# Patient Record
Sex: Female | Born: 1975 | Race: Black or African American | Hispanic: No
Health system: Southern US, Community
[De-identification: ages and names within clinical notes are randomized; demographics above are authoritative.]

---
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
## 2002-07-19 HISTORY — PX: BREAST EXCISIONAL BIOPSY: SUR124

## 2009-09-24 ENCOUNTER — Encounter: Admission: RE | Admit: 2009-09-24 | Discharge: 2009-09-24 | Payer: Self-pay | Admitting: Obstetrics and Gynecology

## 2011-11-09 ENCOUNTER — Other Ambulatory Visit (HOSPITAL_COMMUNITY)
Admission: RE | Admit: 2011-11-09 | Discharge: 2011-11-09 | Disposition: A | Discharge status: Home / Self Care | Payer: BC Managed Care – PPO | Source: Ambulatory Visit | Attending: Obstetrics and Gynecology | Admitting: Obstetrics and Gynecology

## 2011-11-09 DIAGNOSIS — Z124 Encounter for screening for malignant neoplasm of cervix: Secondary | ICD-10-CM | POA: Insufficient documentation

## 2011-11-09 DIAGNOSIS — Z1159 Encounter for screening for other viral diseases: Secondary | ICD-10-CM | POA: Insufficient documentation

## 2011-11-15 ENCOUNTER — Other Ambulatory Visit: Payer: Self-pay | Admitting: Obstetrics and Gynecology

## 2011-11-15 DIAGNOSIS — Z1231 Encounter for screening mammogram for malignant neoplasm of breast: Secondary | ICD-10-CM

## 2011-11-19 ENCOUNTER — Ambulatory Visit
Admission: RE | Admit: 2011-11-19 | Discharge: 2011-11-19 | Disposition: A | Discharge status: Home / Self Care | Payer: BC Managed Care – PPO | Source: Ambulatory Visit | Attending: Obstetrics and Gynecology | Admitting: Obstetrics and Gynecology

## 2011-11-19 DIAGNOSIS — Z1231 Encounter for screening mammogram for malignant neoplasm of breast: Secondary | ICD-10-CM

## 2011-11-22 ENCOUNTER — Ambulatory Visit: Payer: Self-pay

## 2013-08-28 ENCOUNTER — Other Ambulatory Visit: Payer: Self-pay

## 2013-08-28 DIAGNOSIS — Z1231 Encounter for screening mammogram for malignant neoplasm of breast: Secondary | ICD-10-CM

## 2013-09-21 ENCOUNTER — Ambulatory Visit
Admission: RE | Admit: 2013-09-21 | Discharge: 2013-09-21 | Disposition: A | Discharge status: Home / Self Care | Payer: BC Managed Care – PPO | Source: Ambulatory Visit

## 2013-09-21 DIAGNOSIS — Z1231 Encounter for screening mammogram for malignant neoplasm of breast: Secondary | ICD-10-CM

## 2014-09-27 ENCOUNTER — Other Ambulatory Visit: Payer: Self-pay

## 2014-09-27 DIAGNOSIS — Z1231 Encounter for screening mammogram for malignant neoplasm of breast: Secondary | ICD-10-CM

## 2014-10-09 ENCOUNTER — Ambulatory Visit
Admission: RE | Admit: 2014-10-09 | Discharge: 2014-10-09 | Disposition: A | Discharge status: Home / Self Care | Payer: BLUE CROSS/BLUE SHIELD | Source: Ambulatory Visit

## 2014-10-09 DIAGNOSIS — Z1231 Encounter for screening mammogram for malignant neoplasm of breast: Secondary | ICD-10-CM

## 2015-07-29 MED FILL — DOXYCYCLINE MONO 100 MG CAP: 100 | 5 days supply | Qty: 10 | Fill #0

## 2015-09-08 ENCOUNTER — Other Ambulatory Visit: Payer: Self-pay

## 2015-09-08 DIAGNOSIS — Z1231 Encounter for screening mammogram for malignant neoplasm of breast: Secondary | ICD-10-CM

## 2015-10-10 ENCOUNTER — Ambulatory Visit
Admission: RE | Admit: 2015-10-10 | Discharge: 2015-10-10 | Disposition: A | Discharge status: Home / Self Care | Payer: 59 | Source: Ambulatory Visit

## 2015-10-10 DIAGNOSIS — Z1231 Encounter for screening mammogram for malignant neoplasm of breast: Secondary | ICD-10-CM

## 2015-10-15 ENCOUNTER — Other Ambulatory Visit: Payer: Self-pay | Admitting: Obstetrics and Gynecology

## 2015-10-15 DIAGNOSIS — R928 Other abnormal and inconclusive findings on diagnostic imaging of breast: Secondary | ICD-10-CM

## 2015-10-17 ENCOUNTER — Ambulatory Visit
Admission: RE | Admit: 2015-10-17 | Discharge: 2015-10-17 | Disposition: A | Discharge status: Home / Self Care | Payer: 59 | Source: Ambulatory Visit | Attending: Obstetrics and Gynecology | Admitting: Obstetrics and Gynecology

## 2015-10-17 DIAGNOSIS — R928 Other abnormal and inconclusive findings on diagnostic imaging of breast: Secondary | ICD-10-CM

## 2015-10-22 ENCOUNTER — Other Ambulatory Visit: Payer: 59

## 2016-09-08 ENCOUNTER — Other Ambulatory Visit: Payer: Self-pay | Admitting: Obstetrics and Gynecology

## 2016-09-08 DIAGNOSIS — Z1231 Encounter for screening mammogram for malignant neoplasm of breast: Secondary | ICD-10-CM

## 2016-10-11 ENCOUNTER — Ambulatory Visit
Admission: RE | Admit: 2016-10-11 | Discharge: 2016-10-11 | Disposition: A | Discharge status: Home / Self Care | Payer: BLUE CROSS/BLUE SHIELD | Source: Ambulatory Visit | Attending: Obstetrics and Gynecology | Admitting: Obstetrics and Gynecology

## 2016-10-11 DIAGNOSIS — Z1231 Encounter for screening mammogram for malignant neoplasm of breast: Secondary | ICD-10-CM

## 2017-09-19 ENCOUNTER — Other Ambulatory Visit: Payer: Self-pay | Admitting: Obstetrics and Gynecology

## 2017-09-19 DIAGNOSIS — Z1231 Encounter for screening mammogram for malignant neoplasm of breast: Secondary | ICD-10-CM

## 2017-10-12 ENCOUNTER — Ambulatory Visit
Admission: RE | Admit: 2017-10-12 | Discharge: 2017-10-12 | Disposition: A | Discharge status: Home / Self Care | Payer: BLUE CROSS/BLUE SHIELD | Source: Ambulatory Visit | Attending: Obstetrics and Gynecology | Admitting: Obstetrics and Gynecology

## 2017-10-12 DIAGNOSIS — Z1231 Encounter for screening mammogram for malignant neoplasm of breast: Secondary | ICD-10-CM

## 2018-04-24 ENCOUNTER — Other Ambulatory Visit: Payer: Self-pay | Admitting: Nurse Practitioner

## 2018-04-24 DIAGNOSIS — N63 Unspecified lump in unspecified breast: Secondary | ICD-10-CM

## 2018-04-26 ENCOUNTER — Ambulatory Visit
Admission: RE | Admit: 2018-04-26 | Discharge: 2018-04-26 | Disposition: A | Discharge status: Home / Self Care | Payer: BLUE CROSS/BLUE SHIELD | Source: Ambulatory Visit | Attending: Nurse Practitioner | Admitting: Nurse Practitioner

## 2018-04-26 DIAGNOSIS — N63 Unspecified lump in unspecified breast: Secondary | ICD-10-CM

## 2018-10-02 ENCOUNTER — Other Ambulatory Visit: Payer: Self-pay | Admitting: Obstetrics and Gynecology

## 2018-10-02 DIAGNOSIS — Z1231 Encounter for screening mammogram for malignant neoplasm of breast: Secondary | ICD-10-CM

## 2018-10-26 ENCOUNTER — Ambulatory Visit: Payer: BLUE CROSS/BLUE SHIELD

## 2018-12-06 ENCOUNTER — Ambulatory Visit
Admission: RE | Admit: 2018-12-06 | Discharge: 2018-12-06 | Disposition: A | Discharge status: Home / Self Care | Payer: BLUE CROSS/BLUE SHIELD | Source: Ambulatory Visit | Attending: Obstetrics and Gynecology | Admitting: Obstetrics and Gynecology

## 2018-12-06 ENCOUNTER — Other Ambulatory Visit: Payer: Self-pay

## 2018-12-06 DIAGNOSIS — Z1231 Encounter for screening mammogram for malignant neoplasm of breast: Secondary | ICD-10-CM

## 2018-12-08 ENCOUNTER — Ambulatory Visit: Payer: BLUE CROSS/BLUE SHIELD

## 2019-04-25 ENCOUNTER — Other Ambulatory Visit: Payer: Self-pay

## 2019-04-25 DIAGNOSIS — Z20822 Contact with and (suspected) exposure to covid-19: Secondary | ICD-10-CM

## 2019-04-27 LAB — NOVEL CORONAVIRUS, NAA: SARS-CoV-2, NAA: NOT DETECTED

## 2019-07-06 ENCOUNTER — Other Ambulatory Visit: Payer: BC Managed Care – PPO

## 2019-07-09 ENCOUNTER — Ambulatory Visit: Payer: BC Managed Care – PPO | Attending: Internal Medicine

## 2019-07-09 DIAGNOSIS — Z20822 Contact with and (suspected) exposure to covid-19: Secondary | ICD-10-CM

## 2019-07-10 LAB — NOVEL CORONAVIRUS, NAA: SARS-CoV-2, NAA: NOT DETECTED

## 2019-07-11 ENCOUNTER — Ambulatory Visit: Payer: BC Managed Care – PPO | Attending: Internal Medicine

## 2019-07-11 DIAGNOSIS — U071 COVID-19: Secondary | ICD-10-CM

## 2019-07-12 LAB — NOVEL CORONAVIRUS, NAA: SARS-CoV-2, NAA: NOT DETECTED

## 2019-08-10 ENCOUNTER — Ambulatory Visit: Payer: BLUE CROSS/BLUE SHIELD | Attending: Internal Medicine

## 2019-08-10 DIAGNOSIS — Z20822 Contact with and (suspected) exposure to covid-19: Secondary | ICD-10-CM

## 2019-08-11 LAB — NOVEL CORONAVIRUS, NAA: SARS-CoV-2, NAA: NOT DETECTED

## 2019-10-18 ENCOUNTER — Ambulatory Visit: Payer: Self-pay | Attending: Family

## 2019-10-18 DIAGNOSIS — Z23 Encounter for immunization: Secondary | ICD-10-CM

## 2019-10-18 NOTE — Progress Notes (Signed)
   Covid-19 Vaccination Clinic  Name:  Sydney Maldonado    MRN: [id number] DOB: 11/18/1975  10/18/2019  [name] was observed post Covid-19 immunization for 15 minutes without incident. She was provided with Vaccine Information Sheet and instruction to access the V-Safe system.   [name] was instructed to call 911 with any severe reactions post vaccine: Marland Kitchen Difficulty breathing  . Swelling of face and throat  . A fast heartbeat  . A bad rash all over body  . Dizziness and weakness   Immunizations Administered    Name Date Dose VIS Date Route   Moderna COVID-19 Vaccine 10/18/2019  4:16 PM 0.5 mL 06/19/2019 Intramuscular   Manufacturer: Moderna   Lot: [id number]   [place]: [phone]

## 2019-11-06 ENCOUNTER — Other Ambulatory Visit: Payer: Self-pay | Admitting: Obstetrics and Gynecology

## 2019-11-06 DIAGNOSIS — Z1231 Encounter for screening mammogram for malignant neoplasm of breast: Secondary | ICD-10-CM

## 2019-11-13 ENCOUNTER — Ambulatory Visit: Payer: Self-pay

## 2019-11-27 ENCOUNTER — Ambulatory Visit: Payer: Self-pay | Attending: Family

## 2019-11-27 DIAGNOSIS — Z23 Encounter for immunization: Secondary | ICD-10-CM

## 2019-11-27 NOTE — Progress Notes (Signed)
   Covid-19 Vaccination Clinic  Name:  Sydney Maldonado    MRN: [id number] DOB: 1976/05/17  11/27/2019  Sydney Maldonado was observed post Covid-19 immunization for 15 minutes without incident. She was provided with Vaccine Information Sheet and instruction to access the V-Safe system.   Sydney Maldonado was instructed to call 911 with any severe reactions post vaccine: Marland Kitchen Difficulty breathing  . Swelling of face and throat  . A fast heartbeat  . A bad rash all over body  . Dizziness and weakness   Immunizations Administered    Name Date Dose VIS Date Route   Moderna COVID-19 Vaccine 11/27/2019 11:25 AM 0.5 mL 06/2019 Intramuscular   Manufacturer: Moderna   Lot: [id number]   [place]: [id number]

## 2020-01-08 ENCOUNTER — Ambulatory Visit
Admission: RE | Admit: 2020-01-08 | Discharge: 2020-01-08 | Disposition: A | Discharge status: Home / Self Care | Payer: BC Managed Care – PPO | Source: Ambulatory Visit | Attending: Obstetrics and Gynecology | Admitting: Obstetrics and Gynecology

## 2020-01-08 ENCOUNTER — Other Ambulatory Visit: Payer: Self-pay

## 2020-01-08 DIAGNOSIS — Z1231 Encounter for screening mammogram for malignant neoplasm of breast: Secondary | ICD-10-CM

## 2020-06-30 ENCOUNTER — Ambulatory Visit: Payer: BC Managed Care – PPO | Attending: Family

## 2020-06-30 DIAGNOSIS — Z23 Encounter for immunization: Secondary | ICD-10-CM

## 2020-10-30 NOTE — Progress Notes (Signed)
   Covid-19 Vaccination Clinic  Name:  Sydney Maldonado    MRN: [id number] DOB: September 05, 1975  10/30/2020  Ms. Carmon was observed post Covid-19 immunization for 15 minutes without incident. She was provided with Vaccine Information Sheet and instruction to access the V-Safe system.   Ms. Hodges was instructed to call 911 with any severe reactions post vaccine: Marland Kitchen Difficulty breathing  . Swelling of face and throat  . A fast heartbeat  . A bad rash all over body  . Dizziness and weakness   Immunizations Administered    Name Date Dose VIS Date Route   Pfizer COVID-19 Vaccine 06/30/2020 10:00 AM 0.3 mL 05/07/2020 Intramuscular   Manufacturer: [place]   Lot: [id number]   [place]: [address]-9

## 2020-11-25 ENCOUNTER — Other Ambulatory Visit: Payer: Self-pay | Admitting: Obstetrics and Gynecology

## 2020-11-25 DIAGNOSIS — Z1231 Encounter for screening mammogram for malignant neoplasm of breast: Secondary | ICD-10-CM

## 2021-01-08 ENCOUNTER — Other Ambulatory Visit: Payer: Self-pay

## 2021-01-08 ENCOUNTER — Ambulatory Visit: Payer: BC Managed Care – PPO

## 2021-01-08 ENCOUNTER — Ambulatory Visit
Admission: RE | Admit: 2021-01-08 | Discharge: 2021-01-08 | Disposition: A | Discharge status: Home / Self Care | Payer: BC Managed Care – PPO | Source: Ambulatory Visit | Attending: Obstetrics and Gynecology | Admitting: Obstetrics and Gynecology

## 2021-01-08 DIAGNOSIS — Z1231 Encounter for screening mammogram for malignant neoplasm of breast: Secondary | ICD-10-CM

## 2021-01-26 ENCOUNTER — Other Ambulatory Visit: Payer: Self-pay | Admitting: Obstetrics and Gynecology

## 2021-01-26 ENCOUNTER — Ambulatory Visit: Payer: BC Managed Care – PPO

## 2021-01-26 DIAGNOSIS — Z1231 Encounter for screening mammogram for malignant neoplasm of breast: Secondary | ICD-10-CM

## 2021-09-14 IMAGING — MG MM DIGITAL SCREENING BILAT W/ TOMO AND CAD
8 series · 9 of 24 positions shown · non-contrast
Comparison: Previous exam(s).

CLINICAL DATA: Screening.

EXAM:
DIGITAL SCREENING BILATERAL MAMMOGRAM WITH TOMOSYNTHESIS AND CAD
TECHNIQUE: Bilateral screening digital craniocaudal and mediolateral oblique
mammograms were obtained. Bilateral screening digital breast
tomosynthesis was performed. The images were evaluated with
computer-aided detection.

[L CC synth-2D]
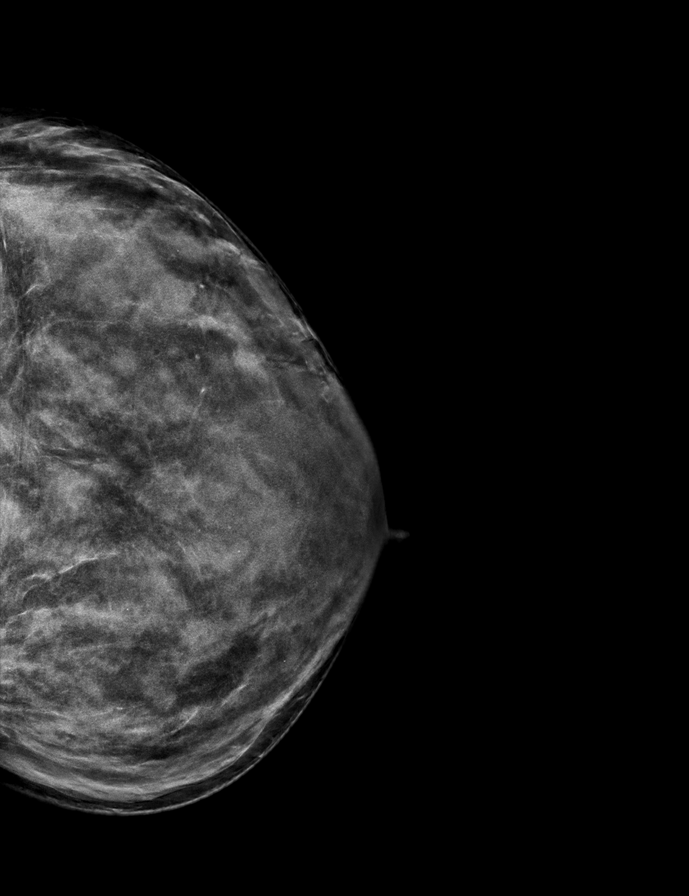

[R CC synth-2D]
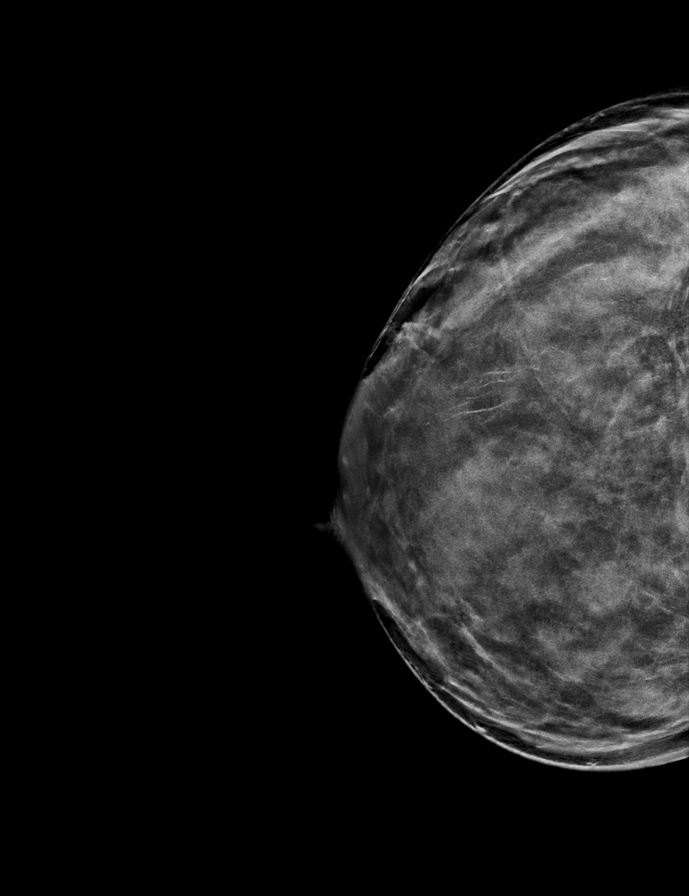

[R MLO synth-2D]
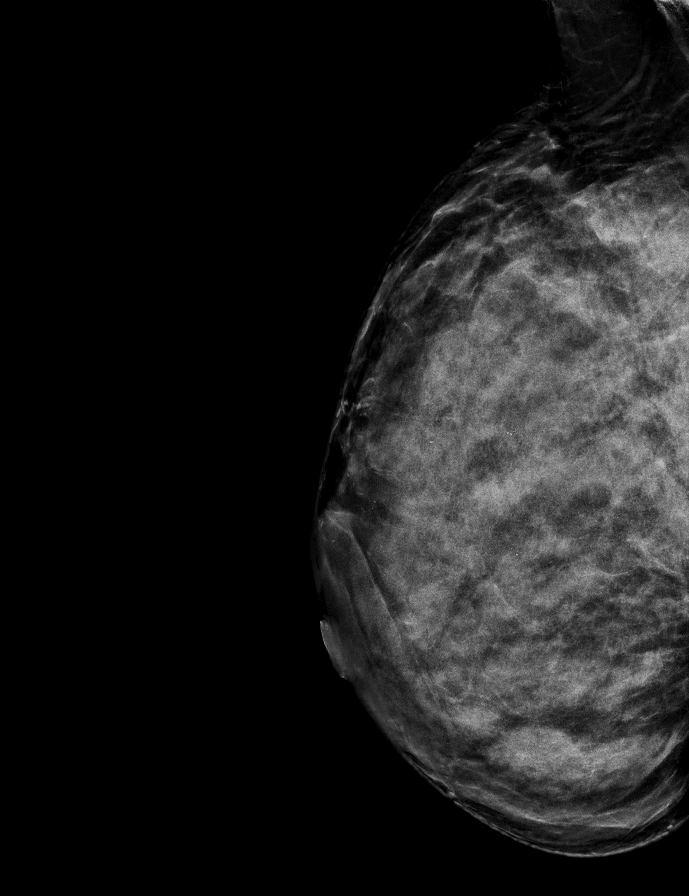

[L MLO synth-2D]
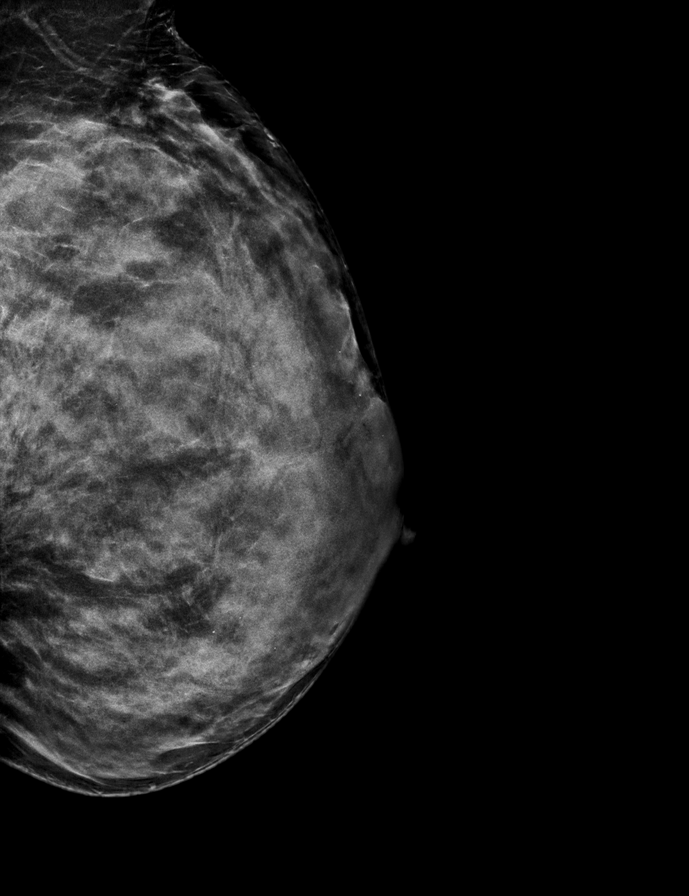

[L CC tomo · 2 of 66 frames shown]
[frame 22/66]
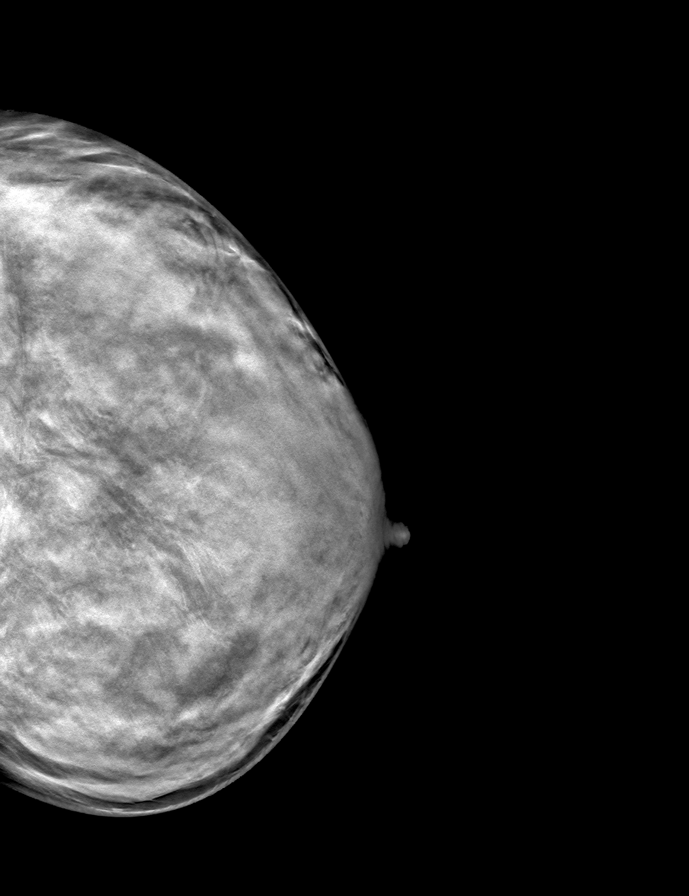
[frame 33/66]
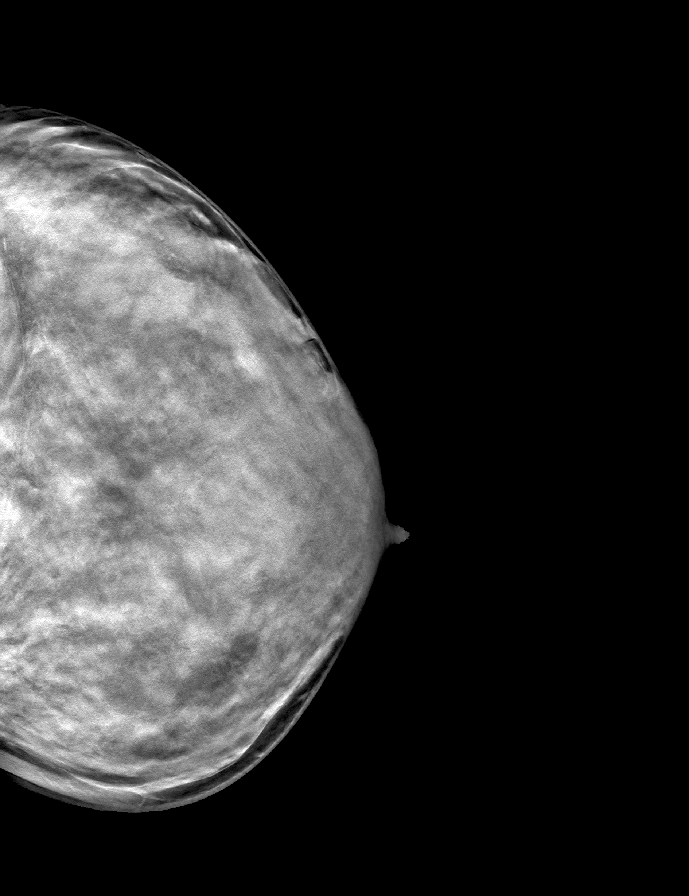

[L MLO tomo · tomo slice 33/65.0]
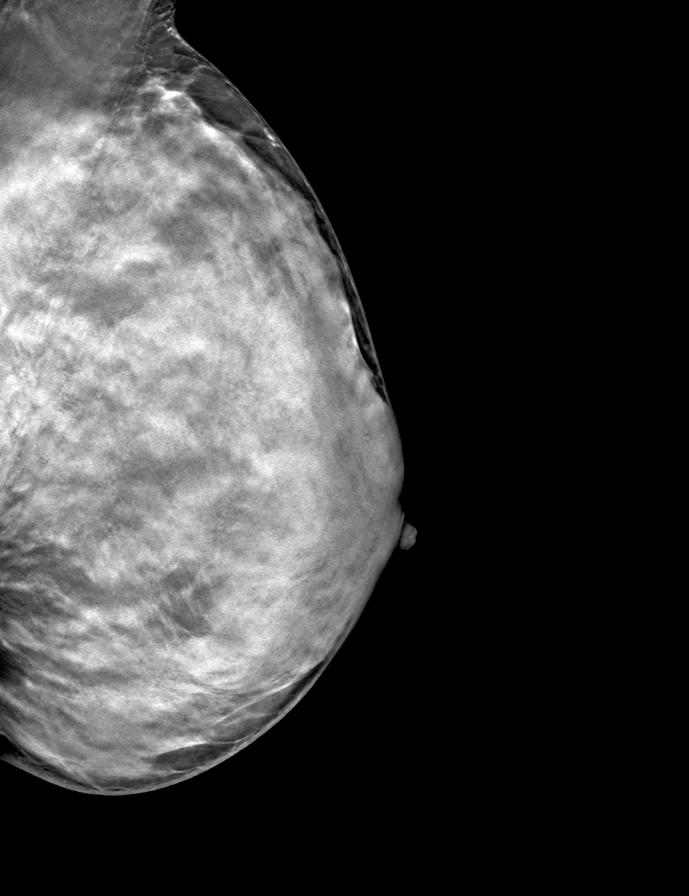

[R CC tomo · tomo slice 36/71.0]
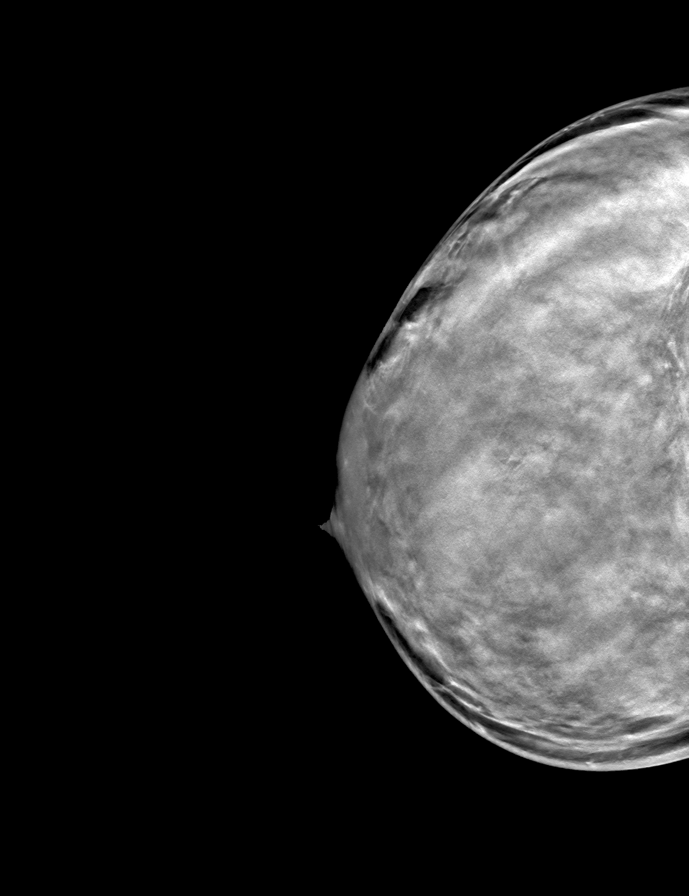

[R MLO tomo · tomo slice 33/65.0]
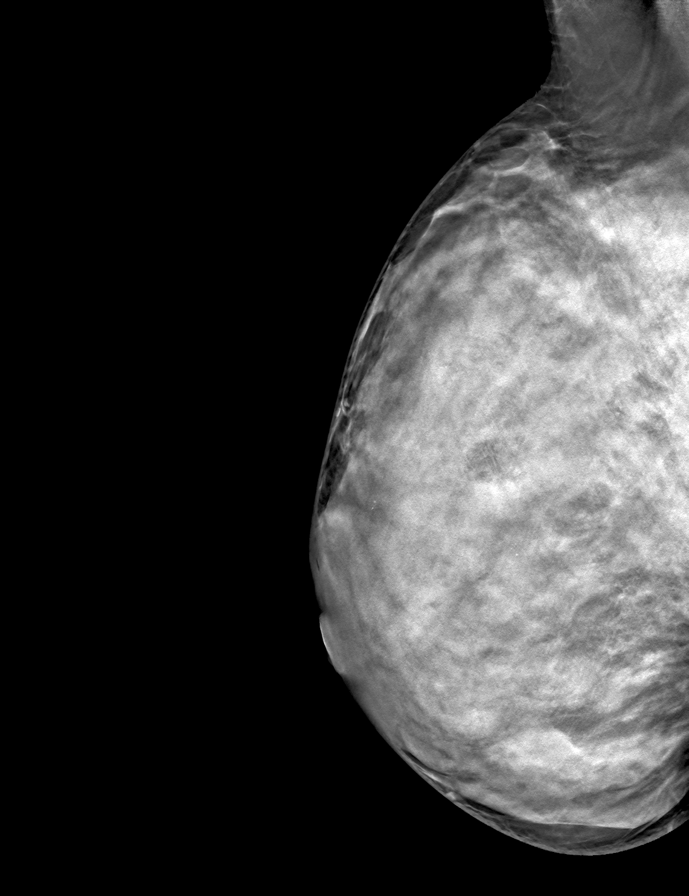

[9 of 24 positions shown; findings below may reference images not displayed]

ACR Breast Density Category d: The breast tissue is extremely dense,
which lowers the sensitivity of mammography
FINDINGS: There are no findings suspicious for malignancy.
IMPRESSION: No mammographic evidence of malignancy. A result letter of this
screening mammogram will be mailed directly to the patient.

RECOMMENDATION:
Screening mammogram in one year. (Code:[id number])

BI-RADS CATEGORY  1: Negative.

## 2022-01-11 ENCOUNTER — Ambulatory Visit: Payer: BC Managed Care – PPO

## 2022-01-14 ENCOUNTER — Ambulatory Visit: Payer: BC Managed Care – PPO

## 2022-01-14 ENCOUNTER — Ambulatory Visit
Admission: RE | Admit: 2022-01-14 | Discharge: 2022-01-14 | Disposition: A | Discharge status: Home / Self Care | Payer: BC Managed Care – PPO | Source: Ambulatory Visit | Attending: Obstetrics and Gynecology | Admitting: Obstetrics and Gynecology

## 2022-01-14 DIAGNOSIS — Z1231 Encounter for screening mammogram for malignant neoplasm of breast: Secondary | ICD-10-CM

## 2022-11-16 ENCOUNTER — Other Ambulatory Visit: Payer: Self-pay | Admitting: Nurse Practitioner

## 2022-11-16 DIAGNOSIS — Z1231 Encounter for screening mammogram for malignant neoplasm of breast: Secondary | ICD-10-CM

## 2023-01-17 ENCOUNTER — Ambulatory Visit
Admission: RE | Admit: 2023-01-17 | Discharge: 2023-01-17 | Disposition: A | Discharge status: Home / Self Care | Payer: BC Managed Care – PPO | Source: Ambulatory Visit | Attending: Nurse Practitioner | Admitting: Nurse Practitioner

## 2023-01-17 DIAGNOSIS — Z1231 Encounter for screening mammogram for malignant neoplasm of breast: Secondary | ICD-10-CM

## 2023-01-19 ENCOUNTER — Other Ambulatory Visit: Payer: Self-pay | Admitting: Nurse Practitioner

## 2023-01-19 DIAGNOSIS — Z1231 Encounter for screening mammogram for malignant neoplasm of breast: Secondary | ICD-10-CM

## 2024-01-18 ENCOUNTER — Ambulatory Visit
Admission: RE | Admit: 2024-01-18 | Discharge: 2024-01-18 | Disposition: A | Discharge status: Home / Self Care | Source: Ambulatory Visit | Attending: Nurse Practitioner | Admitting: Nurse Practitioner

## 2024-01-18 DIAGNOSIS — Z1231 Encounter for screening mammogram for malignant neoplasm of breast: Secondary | ICD-10-CM

## 2024-01-19 ENCOUNTER — Ambulatory Visit: Payer: BC Managed Care – PPO

## 2024-02-21 ENCOUNTER — Encounter (HOSPITAL_COMMUNITY): Payer: Self-pay | Admitting: Nurse Practitioner

## 2024-02-24 ENCOUNTER — Other Ambulatory Visit (HOSPITAL_COMMUNITY): Payer: Self-pay | Admitting: Nurse Practitioner

## 2024-02-24 DIAGNOSIS — R928 Other abnormal and inconclusive findings on diagnostic imaging of breast: Secondary | ICD-10-CM

## 2024-02-29 ENCOUNTER — Ambulatory Visit (HOSPITAL_COMMUNITY): Admission: RE | Admit: 2024-02-29 | Source: Ambulatory Visit

## 2024-02-29 ENCOUNTER — Encounter (HOSPITAL_COMMUNITY): Payer: Self-pay
